# Patient Record
Sex: Male | Born: 1955 | Hispanic: No | Marital: Single | Smoking: Never smoker
Health system: Southern US, Community
[De-identification: ages and names within clinical notes are randomized; demographics above are authoritative.]

## PROBLEM LIST (undated history)

## (undated) DIAGNOSIS — F329 Major depressive disorder, single episode, unspecified: Secondary | ICD-10-CM

## (undated) DIAGNOSIS — F32A Depression, unspecified: Secondary | ICD-10-CM

## (undated) HISTORY — DX: Major depressive disorder, single episode, unspecified: F32.9

## (undated) HISTORY — DX: Depression, unspecified: F32.A

---

## 2008-08-30 ENCOUNTER — Encounter (INDEPENDENT_AMBULATORY_CARE_PROVIDER_SITE_OTHER): Payer: Self-pay | Admitting: General Surgery

## 2008-08-30 ENCOUNTER — Inpatient Hospital Stay (HOSPITAL_COMMUNITY): Admission: EM | Admit: 2008-08-30 | Discharge: 2008-09-01 | Payer: Self-pay | Admitting: Emergency Medicine

## 2009-05-19 ENCOUNTER — Emergency Department (HOSPITAL_COMMUNITY): Admission: EM | Admit: 2009-05-19 | Discharge: 2009-05-19 | Payer: Self-pay | Admitting: Emergency Medicine

## 2010-09-16 LAB — URINALYSIS, ROUTINE W REFLEX MICROSCOPIC
Ketones, ur: NEGATIVE mg/dL
Nitrite: NEGATIVE
Specific Gravity, Urine: 1.027 (ref 1.005–1.030)
pH: 7 (ref 5.0–8.0)

## 2010-09-25 LAB — URINALYSIS, ROUTINE W REFLEX MICROSCOPIC
Glucose, UA: NEGATIVE mg/dL
Hgb urine dipstick: NEGATIVE
Ketones, ur: >80 mg/dL — AB
Protein, ur: NEGATIVE mg/dL

## 2010-09-25 LAB — COMPREHENSIVE METABOLIC PANEL
ALT: 111 U/L — ABNORMAL HIGH (ref 0–53)
Albumin: 4.1 g/dL (ref 3.5–5.2)
Alkaline Phosphatase: 60 U/L (ref 39–117)
Glucose, Bld: 109 mg/dL — ABNORMAL HIGH (ref 70–99)
Potassium: 3.6 mEq/L (ref 3.5–5.1)
Sodium: 137 mEq/L (ref 135–145)
Total Protein: 7.8 g/dL (ref 6.0–8.3)

## 2010-09-25 LAB — CBC
Hemoglobin: 14.9 g/dL (ref 13.0–17.0)
RDW: 14.6 % (ref 11.5–15.5)
WBC: 11.8 10*3/uL — ABNORMAL HIGH (ref 4.0–10.5)

## 2010-09-25 LAB — DIFFERENTIAL
Basophils Relative: 0 % (ref 0–1)
Eosinophils Absolute: 0 10*3/uL (ref 0.0–0.7)
Monocytes Absolute: 0.5 10*3/uL (ref 0.1–1.0)
Monocytes Relative: 4 % (ref 3–12)
Neutrophils Relative %: 88 % — ABNORMAL HIGH (ref 43–77)

## 2010-10-28 NOTE — H&P (Signed)
Clarence Wu, Clarence Wu                 ACCOUNT NO.:  [id number]   MEDICAL RECORD NO.:  [id number]          PATIENT TYPE:  INP   LOCATION:  5149                         FACILITY:  [facility]   PHYSICIAN:  Gabrielle Dare. Janee Morn, M.D.DATE OF BIRTH:  03-Feb-1956   DATE OF ADMISSION:  08/30/2008  DATE OF DISCHARGE:                              HISTORY & PHYSICAL   CHIEF COMPLAINT:  Abdominal pain, right lower quadrant.   HISTORY OF PRESENT ILLNESS:  Clarence Wu is a 55 year old [place] male  patient who does not speak any [place].  All of the history was obtained  via telephone [place].  He presented with abdominal pain  that began about 10 p.m. yesterday on August 29, 2008.  The pain became  very severe with associated nausea, but he was unable to vomit.  There  is a questionable history of possible constipation at least 10 days  preceding these symptoms.  He did present to the ER by midnight because  the pain was very severe.  He was found to have leukocytosis and  clinical exam was consistent with appendicitis.  The patient eventually  underwent a CT scan that confirmed acute appendicitis and subsequent  surgical consultation has been requested.   PAST MEDICAL HISTORY:  The patient denies.   PAST SURGICAL HISTORY:  The patient denies.   FAMILY HISTORY:  Noncontributory.   SOCIAL HISTORY:  The patient is married.  His wife is with him.  He  works nights.  It was unclear what type of job he does.  His supervisor  was contacted yesterday evening as well as today to let them know that  he has been hospitalized and will be undergoing surgical procedure.  Again, the patient does not speak any English and must be communicated  with via [place].  I did use the translator briefly during  the initial portion of the exam, much of the history had been obtained  via the ER staff and this was briefly confirmed with the patient.   DRUG ALLERGIES:  NKDA.   MEDICATIONS:  The  patient denies.   REVIEW OF SYSTEMS:  As per the history of present illness.   PHYSICAL EXAMINATION:  GENERAL:  Pleasant male patient and some noted  abdominal pain, who points to the right side of his abdomen when the  word pain is used.  VITAL SIGNS:  Temperature 98.5, BP 116/75, pulse 65, and respirations  20.  PSYCHIATRIC:  The patient appears to be alert and oriented x3, difficult  to ascertain due to language barrier, but he is appropriate to current  situation.  NEUROLOGIC:  Cranial nerves II-XII are grossly intact.  The patient is  moving all extremities x4.  He was ambulatory to the ER.  HEENT:  Sclerae are mildly injected, nonicteric.  Ears, nose, and  throat:  Ears are symmetrical.  No otorrhea.  Nose is midline.  No  rhinorrhea.  Oral mucous membranes pink and moist.  CHEST:  Bilateral lung sounds clear to auscultation.  Respiratory effort  is nonlabored.  He is satting 98% on room air.  CARDIOVASCULAR:  Heart sounds S1 and S2.  No rubs, murmurs, thrills, or  gallops.  No JVD.  No peripheral edema.  ABDOMEN:  No abdominal bruits.  Soft and nondistended.  No scars or  hernias are noted.  He is tender focally in the right lower quadrant  with guarding.  No rebounding.  EXTREMITIES:  Symmetrical in appearance without cyanosis or clubbing.   LABORATORY DATA AND DIAGNOSTICS:  White count 11,800, hemoglobin 14.9,  platelets 206,000, and neutrophils 80%.  Sodium 137, potassium 3.6, CO2  27, glucose 109, BUN 19, and creatinine 0.94.  CT is consistent with  acute appendicitis.   IMPRESSION:  1. Acute appendicitis.  2. Language barrier.   PLAN:  1. Admit the patient to the general surgical floor, n.p.o. status and      IV fluid hydration.  2. Surgical intervention tonight.  Permit is ready, but will not be      signed until surgeon, Dr. Janee Morn is able to communicate with the      patient via the translator to explain risks and benefits of the      procedure.  I did  briefly go over this with the patient, explained      more than likely will have a laparoscopic approach, and as long as      appendix is not perforated or other complications, he will possibly      be able to go home tomorrow.  In the interim, again empiric Zosyn      on-call to OR and treat symptoms with Morphine, Phenergan, and      Zofran.      Allison L. Rennis Harding, N.P.      Gabrielle Dare Janee Morn, M.D.  Electronically Signed    ALE/MEDQ  D:  08/30/2008  T:  08/31/2008  Job:  [id number]

## 2010-10-28 NOTE — Op Note (Signed)
Clarence Wu, Clarence Wu                 ACCOUNT NO.:  [id number]   MEDICAL RECORD NO.:  [id number]          PATIENT TYPE:  INP   LOCATION:  5149                         FACILITY:  [facility]   PHYSICIAN:  Gabrielle Dare. Janee Morn, M.D.DATE OF BIRTH:  November 29, 1955   DATE OF PROCEDURE:  08/30/2008  DATE OF DISCHARGE:                               OPERATIVE REPORT   PREOPERATIVE DIAGNOSIS:  Acute appendicitis.   POSTOPERATIVE DIAGNOSIS:  Acute appendicitis.   PROCEDURE:  Laparoscopic appendectomy.   SURGEON:  Gabrielle Dare. Janee Morn, MD   ANESTHESIA:  General endotracheal.   HISTORY OF PRESENT ILLNESS:  Clarence Wu is a 55 year old [place]  gentleman who presented with right lower quadrant abdominal pain to  [facility].  Workup including physical exam, history, and CT  scan was consistent with acute appendicitis.  He is brought emergently  to the operating room for appendectomy.   PROCEDURE IN DETAIL:  Informed consent was obtained with the assistance  of an interpreter.  He received intravenous antibiotics and was brought  to the operating room.  General endotracheal anesthesia was administered  by the Anesthesia staff.  Nursing staff placed a Foley catheter.  His  abdomen was prepped and draped in sterile fashion.  Infraumbilical  region was infiltrated with 0.25% Marcaine with epinephrine.  Infraumbilical incision was made.  Subcutaneous tissues were dissected  down revealing the anterior fascia.  This was divided sharply peritoneal  cavity was entered under direct vision without difficulty.  A 0-Vicryl  purse-string suture was placed around the fascial opening and the Hasson  trocar was inserted into the abdomen.  The abdomen was insufflated  carbon dioxide in standard fashion.  Under direct vision, the left lower  quadrant and right midabdomen port were placed.  A 0.25% Marcaine with  epinephrine was used at these port sites.  The laparoscopic exploration  revealed a extremely inflamed  but not quite perforated appendix but a  lot of fibrin on it.  It was retracted superiorly.  The mesoappendix was  then gradually and carefully divided with the harmonic scalpel achieving  excellent hemostasis.  This cleared the way down to the base which was  intact and not significantly inflamed.  The endoscopic GIA stapler was  then fired using a vascular load dividing the appendix at the base.  There was excellent closure with good intact staple line.  The appendix  was placed in an EndoCatch bag and removed from the abdomen via the left  lower quadrant 12-mm port site.  The abdomen was copiously irrigated  with 2 L of warm saline.  The staple line remained completely intact and  the mesoappendix had no bleeding whatsoever.  Irrigation fluid all  returned clear and was evacuated.  Staple line was rechecked and  remained intact and dry.  Ports were then removed under direct vision.  The pneumoperitoneum was released and the Hasson trocar was removed.  The infraumbilical fascia was closed with 10-0 Vicryl pursestring  suture.  The fascia was also closed with a figure-of-eight 2-0 Vicryl  suture.  All 3 wounds were copiously irrigated  and  skin of each was closed with a running 4-0 Monocryl subcuticular  stitch followed by Dermabond.  Sponge, needle, and instrument counts  were all correct.  The patient tolerated the procedure well without  apparent complications and was taken to the recovery room in stable  condition.      Gabrielle Dare Janee Morn, M.D.  Electronically Signed     BET/MEDQ  D:  08/30/2008  T:  08/31/2008  Job:  [id number]

## 2016-10-23 ENCOUNTER — Ambulatory Visit: Payer: Self-pay | Admitting: Urgent Care

## 2016-10-23 ENCOUNTER — Ambulatory Visit (INDEPENDENT_AMBULATORY_CARE_PROVIDER_SITE_OTHER): Payer: BLUE CROSS/BLUE SHIELD

## 2016-10-23 ENCOUNTER — Encounter: Payer: Self-pay | Admitting: Urgent Care

## 2016-10-23 ENCOUNTER — Ambulatory Visit (INDEPENDENT_AMBULATORY_CARE_PROVIDER_SITE_OTHER): Payer: BLUE CROSS/BLUE SHIELD | Admitting: Urgent Care

## 2016-10-23 VITALS — BP 139/91 | HR 67 | Temp 97.6°F | Resp 17 | Ht 63.75 in | Wt 153.0 lb

## 2016-10-23 DIAGNOSIS — M62838 Other muscle spasm: Secondary | ICD-10-CM | POA: Diagnosis not present

## 2016-10-23 DIAGNOSIS — M542 Cervicalgia: Secondary | ICD-10-CM

## 2016-10-23 DIAGNOSIS — G44229 Chronic tension-type headache, not intractable: Secondary | ICD-10-CM

## 2016-10-23 MED ORDER — CYCLOBENZAPRINE HCL 5 MG PO TABS: 5.0000 mg | ORAL_TABLET | Freq: Three times a day (TID) | ORAL | 1 refills | Status: DC | PRN

## 2016-10-23 MED ORDER — NAPROXEN SODIUM 550 MG PO TABS: 550.0000 mg | ORAL_TABLET | Freq: Two times a day (BID) | ORAL | 1 refills | Status: DC

## 2016-10-23 NOTE — Patient Instructions (Addendum)
Muscle Pain, Adult Muscle pain (myalgia) may be mild or severe. In most cases, the pain lasts only a short time and it goes away without treatment. It is normal to feel some muscle pain after starting a workout program. Muscles that have not been used often will be sore at first. Muscle pain may also be caused by many other things, including:  Overuse or muscle strain, especially if you are not in shape. This is the most common cause of muscle pain.  Injury.  Bruises.  Viruses, such as the flu.  Infectious diseases.  A chronic condition that causes muscle tenderness, fatigue, and headache (fibromyalgia).  A condition, such as lupus, in which the body's disease-fighting system attacks other organs in the body (autoimmune or rheumatologic diseases).  Certain drugs, including ACE inhibitors and statins. To diagnose the cause of your muscle pain, your health care provider will do a physical exam and ask questions about the pain and when it began. If you have not had muscle pain for very long, your health care provider may want to wait before doing much testing. If your muscle pain has lasted a long time, your health care provider may want to run tests right away. In some cases, this may include tests to rule out certain conditions or illnesses. Treatment for muscle pain depends on the cause. Home care is often enough to relieve muscle pain. Your health care provider may also prescribe anti-inflammatory medicine. Follow these instructions at home: Activity   If overuse is causing your muscle pain:  Slow down your activities until the pain goes away.  Do regular, gentle exercises if you are not usually active.  Warm up before exercising. Stretch before and after exercising. This can help lower the risk of muscle pain.  Do not continue working out if the pain is very bad. Bad pain could mean that you have injured a muscle. Managing pain and discomfort    If directed, apply ice to the  sore muscle:  Put ice in a plastic bag.  Place a towel between your skin and the bag.  Leave the ice on for 20 minutes, 2-3 times a day.  You may also alternate between applying ice and applying heat as told by your health care provider. To apply heat, use the heat source that your health care provider recommends, such as a moist heat pack or a heating pad.  Place a towel between your skin and the heat source.  Leave the heat on for 20-30 minutes.  Remove the heat if your skin turns bright red. This is especially important if you are unable to feel pain, heat, or cold. You may have a greater risk of getting burned. Medicines   Take over-the-counter and prescription medicines only as told by your health care provider.  Do not drive or use heavy machinery while taking prescription pain medicine. Contact a health care provider if:  Your muscle pain gets worse and medicines do not help.  You have muscle pain that lasts longer than 3 days.  You have a rash or fever along with muscle pain.  You have muscle pain after a tick bite.  You have muscle pain while working out, even though you are in good physical condition.  You have redness, soreness, or swelling along with muscle pain.  You have muscle pain after starting a new medicine or changing the dose of a medicine. Get help right away if:  You have trouble breathing.  You have trouble swallowing.  You have muscle pain along with a stiff neck, fever, and vomiting.  You have severe muscle weakness or cannot move part of your body. This information is not intended to replace advice given to you by your health care provider. Make sure you discuss any questions you have with your health care provider. Document Released: 04/23/2006 Document Revised: 12/20/2015 Document Reviewed: 10/22/2015 Elsevier Interactive Patient Education  2017 Elsevier Inc.    Muscle Cramps and Spasms Muscle cramps and spasms occur when a muscle or  muscles tighten and you have no control over this tightening (involuntary muscle contraction). They are a common problem and can develop in any muscle. The most common place is in the calf muscles of the leg. Muscle cramps and muscle spasms are both involuntary muscle contractions, but there are some differences between the two:  Muscle cramps are painful. They come and go and may last a few seconds to 15 minutes. Muscle cramps are often more forceful and last longer than muscle spasms.  Muscle spasms may or may not be painful. They may also last just a few seconds or much longer. Certain medical conditions, such as diabetes or Parkinson disease, can make it more likely to develop cramps or spasms. However, cramps or spasms are usually not caused by a serious underlying problem. Common causes include:  Overexertion.  Overuse from repetitive motions, or doing the same thing over and over.  Remaining in a certain position for a long period of time.  Improper preparation, form, or technique while playing a sport or doing an activity.  Dehydration.  Injury.  Side effects of some medicines.  Abnormally low levels of the salts and ions in your blood (electrolytes), especially potassium and calcium. This could happen if you are taking water pills (diuretics) or if you are pregnant. In many cases, the cause of muscle cramps or spasms is unknown. Follow these instructions at home:  Stay well hydrated. Drink enough fluid to keep your urine clear or pale yellow.  Try massaging, stretching, and relaxing the affected muscle.  If directed, apply heat to tight or tense muscles as often as told by your health care provider. Use the heat source that your health care provider recommends, such as a moist heat pack or a heating pad.  Place a towel between your skin and the heat source.  Leave the heat on for 20-30 minutes.  Remove the heat if your skin turns bright red. This is especially important if  you are unable to feel pain, heat, or cold. You may have a greater risk of getting burned.  If directed, put ice on the affected area. This may help if you are sore or have pain after a cramp or spasm.  Put ice in a plastic bag.  Place a towel between your skin and the bag.  Leavethe ice on for 20 minutes, 2-3 times a day.  Take over-the-counter and prescription medicines only as told by your health care provider.  Pay attention to any changes in your symptoms. Contact a health care provider if:  Your cramps or spasms get more severe or happen more often.  Your cramps or spasms do not improve over time. This information is not intended to replace advice given to you by your health care provider. Make sure you discuss any questions you have with your health care provider. Document Released: 11/21/2001 Document Revised: 07/03/2015 Document Reviewed: 03/05/2015 Elsevier Interactive Patient Education  2017 [employer]   IF you received an x-ray today,  you will receive an invoice from [facility]. Please contact [facility] at [phone] with questions or concerns regarding your invoice.   IF you received labwork today, you will receive an invoice from [facility]. Please contact [facility] at [phone] with questions or concerns regarding your invoice.   Our billing staff will not be able to assist you with questions regarding bills from these companies.  You will be contacted with the lab results as soon as they are available. The fastest way to get your results is to activate your My Chart account. Instructions are located on the last page of this paperwork. If you have not heard from [place] regarding the results in 2 weeks, please contact this office.

## 2016-10-23 NOTE — Progress Notes (Signed)
  MRN: [id number] DOB: 11/05/1955  Subjective:   Clarence Wu is a 61 y.o. male presenting for chief complaint of Headache (onset 3-4 months)  Reports 4 month history of intermittent headache. The pain starts at the trapezius on the left and moves up to his posterior neck and temporal scalp. Has occasional blurred vision with his headaches. Works in a hot environment, has very physically demanding job. Does a lot of sudden heavy lifting but denies trauma. Denies fever, blurred vision, dizziness, weakness, confusion, speech difficulty, n/v. Denies history of neck injuries, surgeries. Drinks ~24 ounces of water daily. Denies smoking cigarettes or drinking alcohol.   Clarence Wu is not currently taking any medications. Also has No Known Allergies. Clarence Wu  has a past medical history of Depression. Also denies past surgical history.   Objective:   Vitals: BP (!) 139/91 (BP Location: Right Arm, Patient Position: Sitting, Cuff Size: Normal)   Pulse 67   Temp 97.6 F (36.4 C) (Oral)   Resp 17   Ht 5' 3.75" (1.619 m)   Wt 153 lb (69.4 kg)   SpO2 95%   BMI 26.47 kg/m   Physical Exam  Constitutional: He is oriented to person, place, and time. He appears well-developed and well-nourished.  Cardiovascular: Normal rate.   Pulmonary/Chest: Effort normal.  Musculoskeletal:       Cervical back: He exhibits decreased range of motion (flexion, extension), tenderness (over trapezius, L>R, and paraspinal muscles of the neck) and spasm. He exhibits no bony tenderness, no swelling, no edema, no deformity and no laceration.  Negative Spurling maneuver.  Neurological: He is alert and oriented to person, place, and time. He displays normal reflexes.    Dg Cervical Spine Complete  Result Date: 10/23/2016 CLINICAL DATA:  Neck pain EXAM: CERVICAL SPINE - COMPLETE 4+ VIEW COMPARISON:  None. FINDINGS: There is no evidence of cervical spine fracture or prevertebral soft tissue swelling. Alignment is normal. No other  significant bone abnormalities are identified. IMPRESSION: Negative cervical spine radiographs. Electronically Signed   By: Charlett NoseKevin  Dover M.D.   On: 10/23/2016 10:01   Assessment and Plan :   1. Neck pain 2. Trapezius muscle spasm 3. Chronic tension-type headache, not intractable - X-ray findings reassuring. I suspect his symptoms are due to overuse, lack of hydration. Will start Anaprox, Flexeril, work restrictions. Patient is to start hydrating much better. F/u in 1 week.  Wallis BambergMario Tori Cupps, PA-C [facility] [phone] 10/23/2016  9:18 AM

## 2018-04-27 ENCOUNTER — Other Ambulatory Visit: Payer: Self-pay

## 2018-04-27 ENCOUNTER — Ambulatory Visit (HOSPITAL_COMMUNITY)
Admission: EM | Admit: 2018-04-27 | Discharge: 2018-04-27 | Disposition: A | Discharge status: Home / Self Care | Payer: BLUE CROSS/BLUE SHIELD | Attending: Family Medicine | Admitting: Family Medicine

## 2018-04-27 ENCOUNTER — Encounter (HOSPITAL_COMMUNITY): Payer: Self-pay | Admitting: Family Medicine

## 2018-04-27 DIAGNOSIS — R519 Headache, unspecified: Secondary | ICD-10-CM

## 2018-04-27 DIAGNOSIS — R030 Elevated blood-pressure reading, without diagnosis of hypertension: Secondary | ICD-10-CM

## 2018-04-27 DIAGNOSIS — R51 Headache: Secondary | ICD-10-CM

## 2018-04-27 MED ORDER — NAPROXEN 500 MG PO TABS: 500.0000 mg | ORAL_TABLET | Freq: Two times a day (BID) | ORAL | 0 refills | Status: AC

## 2018-04-27 MED ORDER — CYCLOBENZAPRINE HCL 5 MG PO TABS: 5.0000 mg | ORAL_TABLET | Freq: Every evening | ORAL | 0 refills | Status: AC | PRN

## 2018-04-27 NOTE — ED Triage Notes (Signed)
Pt complains of a headache intermittently over the last month.  He has not taken any OTC medication for this.  His friend is with him and interpreted for triage.  Pt's BP is elevated today in our clinic.

## 2018-04-27 NOTE — Discharge Instructions (Addendum)
  Please seek prompt medical care if: You have: A very bad (severe) headache that is not helped by medicine. Trouble walking or weakness in your arms and legs. Clear or bloody fluid coming from your nose or ears. Changes in your seeing (vision). Jerky movements that you cannot control (seizure). You throw up (vomit). Your symptoms get worse. You lose balance. Your speech is slurred. You pass out. You are sleepier and have trouble staying awake. The black centers of your eyes (pupils) change in size.  These symptoms may be an emergency. Do not wait to see if the symptoms will go away. Get medical help right away. Call your local emergency services. Do not drive yourself to the hospital.  

## 2018-04-27 NOTE — ED Provider Notes (Signed)
[facility]   [id number] 04/27/18 Arrival Time: 1551  ASSESSMENT & PLAN:  1. Nonintractable episodic headache, unspecified headache type   2. Elevated blood pressure reading in office without diagnosis of hypertension   Suspect tension-type. Normal neurologic exam. Reassured. No indication for CT of head at this time. Discussed close f/u or f/u in ED with any new or worsening symptoms.  Recommend return in one week for BP check.  Meds ordered this encounter  Medications  . naproxen (NAPROSYN) 500 MG tablet    Sig: Take 1 tablet (500 mg total) by mouth 2 (two) times daily.    Dispense:  20 tablet    Refill:  0  . cyclobenzaprine (FLEXERIL) 5 MG tablet    Sig: Take 1 tablet (5 mg total) by mouth at bedtime as needed for muscle spasms.    Dispense:  10 tablet    Refill:  0   Medication sedation precautions.  Follow-up Information    [facility].   Specialty:  Emergency Medicine Why:  If symptoms worsen. Contact information: [address] [id number] [place] [id number] [phone]         Reviewed expectations re: course of current medical issues. Questions answered. Outlined signs and symptoms indicating need for more acute intervention. Patient verbalized understanding. After Visit Summary given.   SUBJECTIVE:  Clarence Wu is a 62 y.o. male who presents with complaint of a headache without aura. Reports gradual onset 1 day ago. Location: poorly localized; "moves around"; mostly describes as superior and occipital. Occasional neck soreness. History of headaches: occasional. Did see medical provider in 2018 and diagnosed with tension type headache. NSAID and muscle relaxer helped. Precipitating factors include: none which have been determined. Associated symptoms: Preceding aura: no. Nausea/vomiting: no. Vision changes: no. Increased sensitivity to light and to noises: no. Fever: no. Sinus  pressure/congestion: no. Extremity weakness: no. Home treatment has included resting with little improvement. Current headache does not limit normal daily activities. This is not the worst headache of his life. The patient denies dizziness, loss of balance, muscle weakness, speech difficulties and worsening school/work performance. No head injury reported. No CP/SOB/LE edema. Ambulatory without difficulty.  ROS: As per HPI. All other systems negative.   OBJECTIVE:  Vitals:   04/27/18 1607  BP: (!) 156/95  Pulse: 71  Temp: 98.5 F (36.9 C)  TempSrc: Oral  SpO2: 97%    General appearance: alert; no distress; does appear fatigued Eyes: PERRLA; EOMI; conjunctiva normal HENT: normocephalic; atraumatic Neck: supple with FROM; no midline tenderness Lungs: clear to auscultation bilaterally; unlabored respirations Heart: regular rate and rhythm without murmer Extremities: no edema; symmetrical with no gross deformities Skin: warm and dry Neurologic: CN 2-12 grossly intact; normal gait; normal symmetric reflexes; normal extremity strength and sensation throughout Psychological: alert and cooperative; normal mood and affect  No Known Allergies  Past Medical History:  Diagnosis Date  . Depression    Social History   Socioeconomic History  . Marital status: Single    Spouse name: Not on file  . Number of children: Not on file  . Years of education: Not on file  . Highest education level: Not on file  Occupational History  . Not on file  Social Needs  . Financial resource strain: Not on file  . Food insecurity:    Worry: Not on file    Inability: Not on file  . Transportation needs:    Medical: Not on file  Non-medical: Not on file  Tobacco Use  . Smoking status: Never Smoker  . Smokeless tobacco: Never Used  Substance and Sexual Activity  . Alcohol use: No  . Drug use: No  . Sexual activity: Not on file  Lifestyle  . Physical activity:    Days per week: Not on  file    Minutes per session: Not on file  . Stress: Not on file  Relationships  . Social connections:    Talks on phone: Not on file    Gets together: Not on file    Attends religious service: Not on file    Active member of club or organization: Not on file    Attends meetings of clubs or organizations: Not on file    Relationship status: Not on file  . Intimate partner violence:    Fear of current or ex partner: Not on file    Emotionally abused: Not on file    Physically abused: Not on file    Forced sexual activity: Not on file  Other Topics Concern  . Not on file  Social History Narrative  . Not on file   FH: No history of migraines known. No h/o CVA/TIA known.  History reviewed. No pertinent surgical history.   Mardella Layman, MD 04/28/18 [phone]

## 2018-10-01 IMAGING — DX DG CERVICAL SPINE COMPLETE 4+V
6 series · 6 of 6 positions shown · non-contrast
Comparison: None.

CLINICAL DATA: Neck pain

EXAM:
CERVICAL SPINE - COMPLETE 4+ VIEW

[c-spine lat]
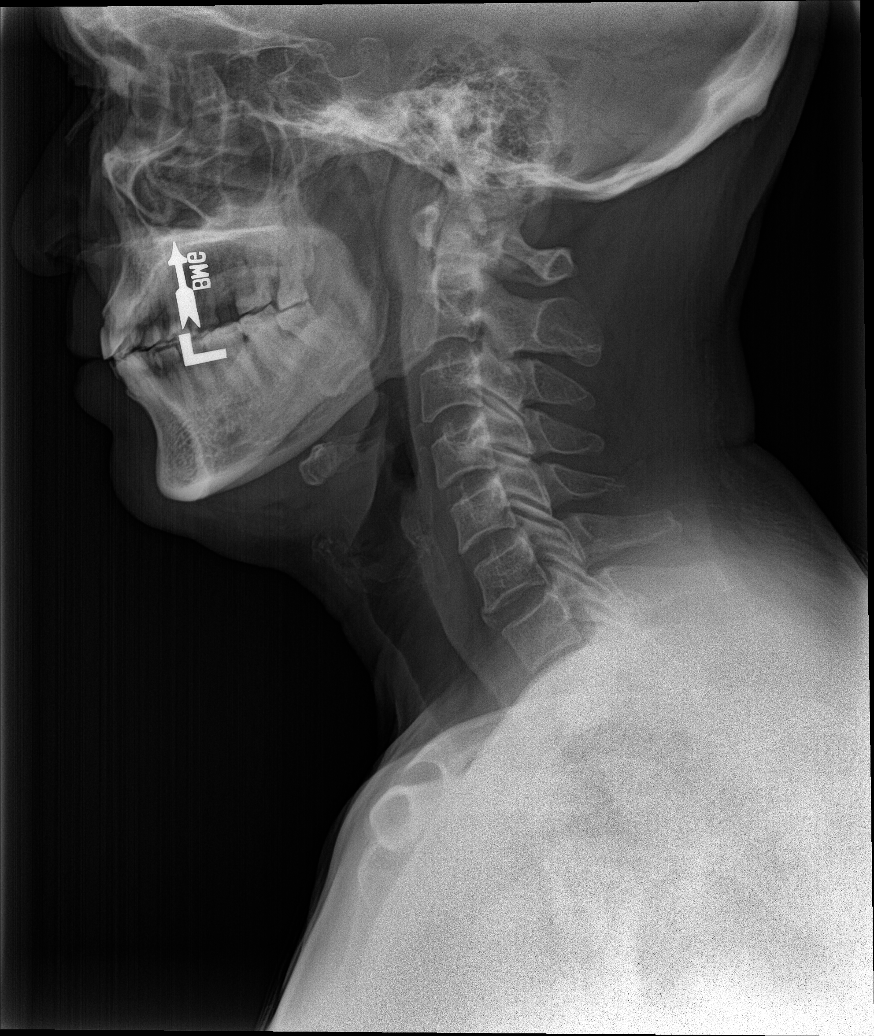

[c-spine obl (1 of 2)]
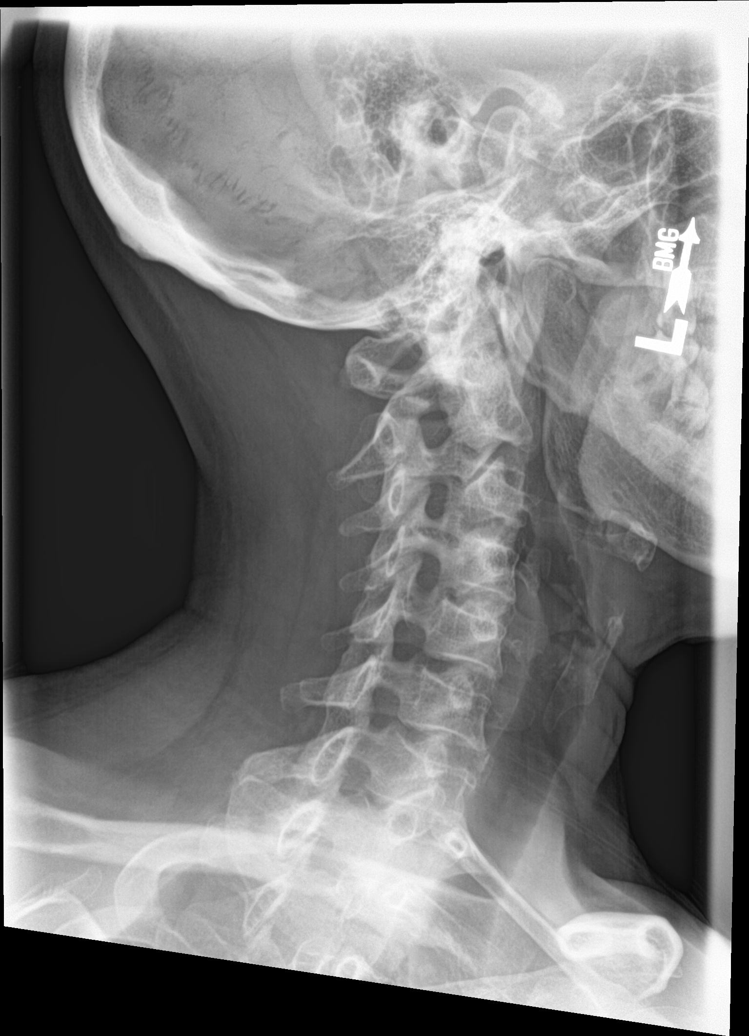

[c-spine obl (2 of 2)]
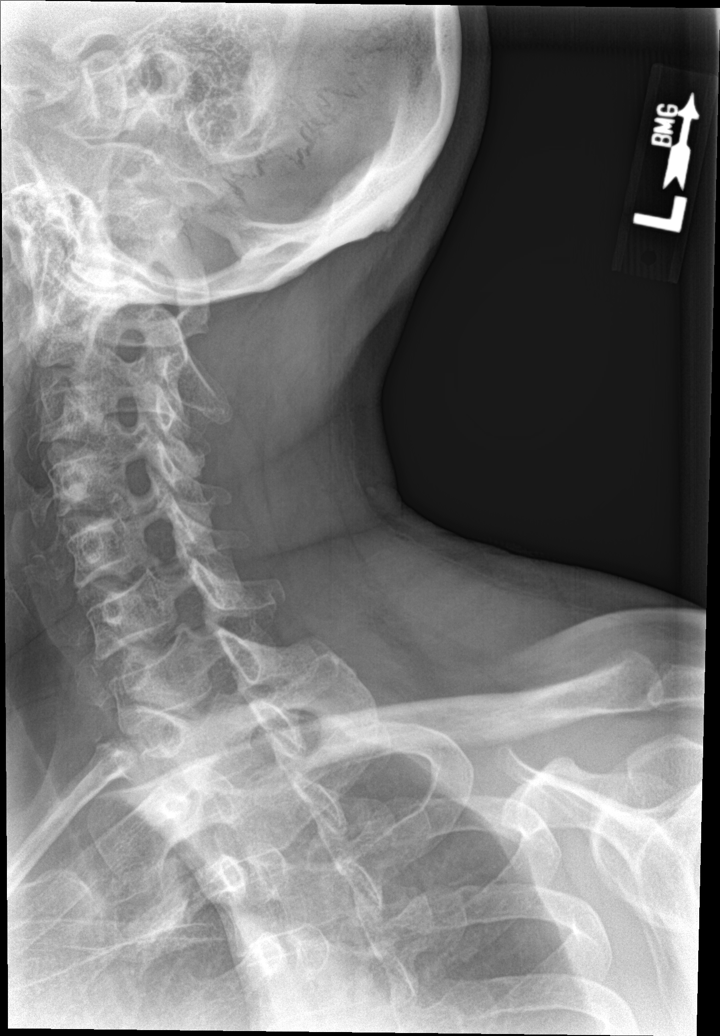

[c-spine ap]
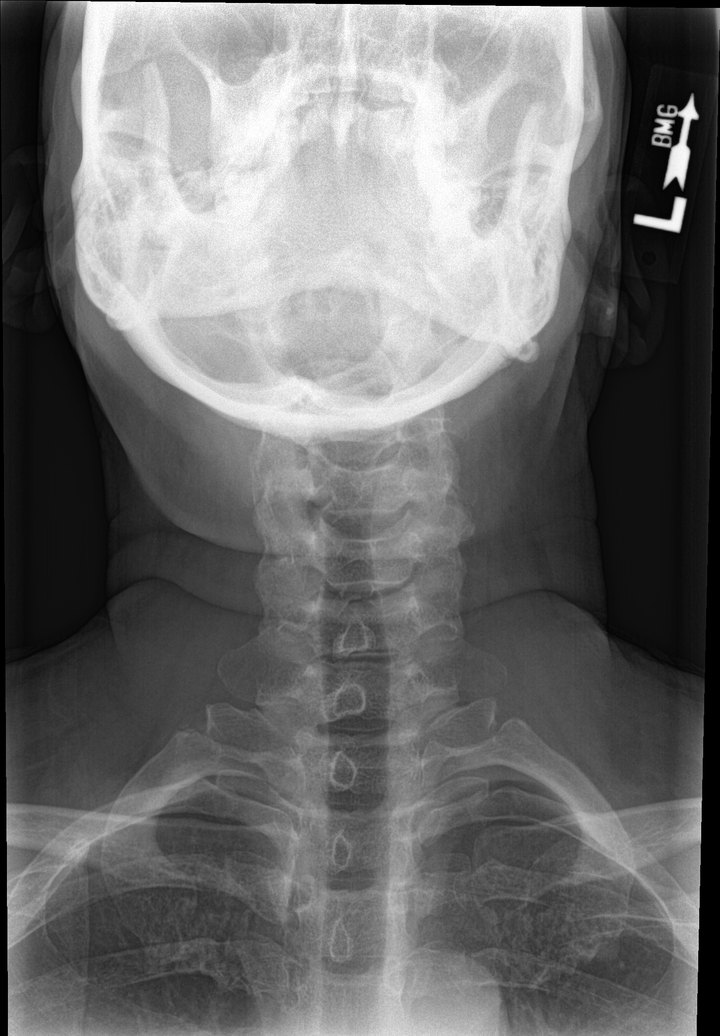

[c-spine open mouth]
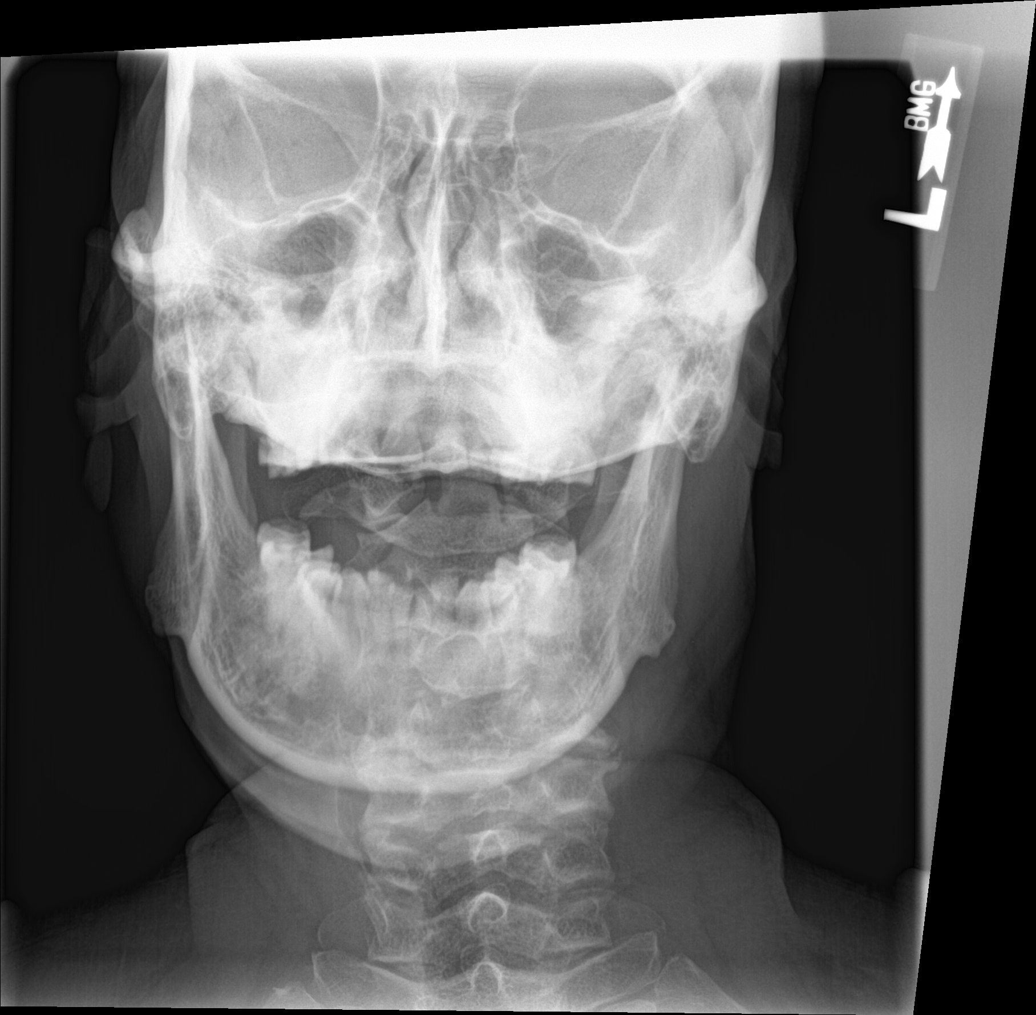

[c-spine swimmers]
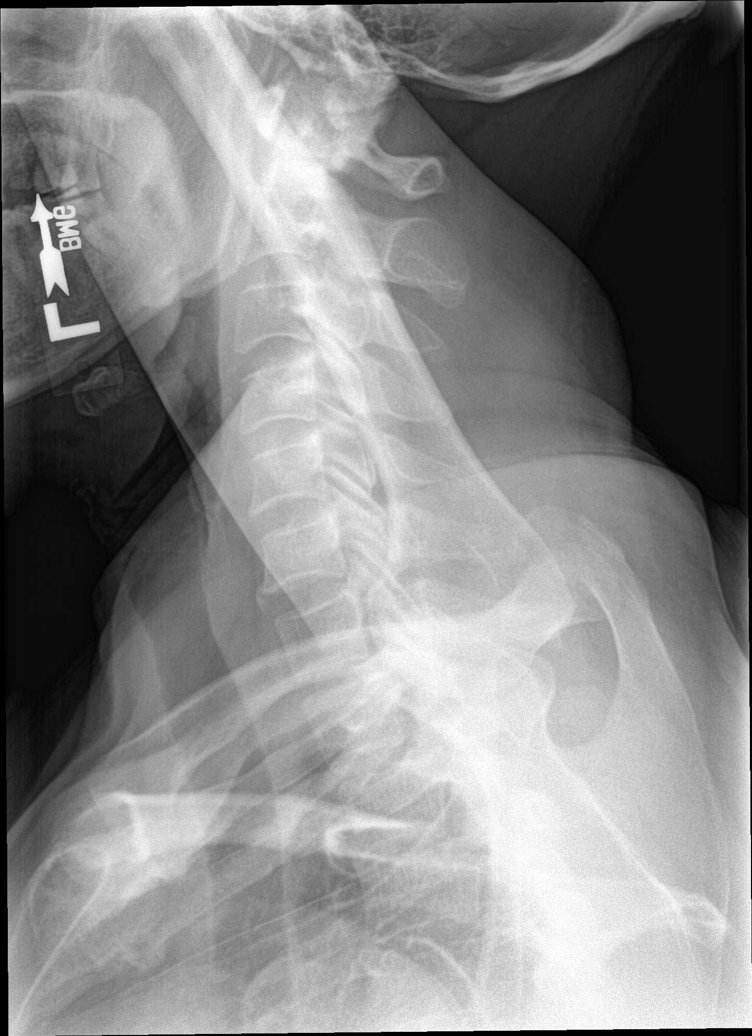

[6 of 6 positions shown; findings below may reference images not displayed]

FINDINGS: There is no evidence of cervical spine fracture or prevertebral soft
tissue swelling. Alignment is normal. No other significant bone
abnormalities are identified.
IMPRESSION: Negative cervical spine radiographs.
